# Patient Record
Sex: Male | Born: 2000 | Race: Black or African American | Hispanic: No | Marital: Single | Smoking: Never smoker
Health system: Southern US, Community
[De-identification: ages and names within clinical notes are randomized; demographics above are authoritative.]

---
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
## 2023-04-23 DIAGNOSIS — Z419 Encounter for procedure for purposes other than remedying health state, unspecified: Secondary | ICD-10-CM | POA: Diagnosis not present

## 2023-05-24 DIAGNOSIS — Z419 Encounter for procedure for purposes other than remedying health state, unspecified: Secondary | ICD-10-CM | POA: Diagnosis not present

## 2023-06-23 DIAGNOSIS — Z419 Encounter for procedure for purposes other than remedying health state, unspecified: Secondary | ICD-10-CM | POA: Diagnosis not present

## 2023-07-04 ENCOUNTER — Ambulatory Visit: Payer: Medicaid Other | Admitting: Nurse Practitioner

## 2023-07-04 ENCOUNTER — Telehealth: Payer: Self-pay

## 2023-07-04 NOTE — Telephone Encounter (Signed)
Np did not show up for appt today. Yet to block from future re-scheduling. Pls advise.

## 2023-07-08 NOTE — Telephone Encounter (Signed)
Unable to reschedule at [facility]

## 2023-07-24 DIAGNOSIS — Z419 Encounter for procedure for purposes other than remedying health state, unspecified: Secondary | ICD-10-CM | POA: Diagnosis not present

## 2023-08-24 DIAGNOSIS — Z419 Encounter for procedure for purposes other than remedying health state, unspecified: Secondary | ICD-10-CM | POA: Diagnosis not present

## 2023-09-21 DIAGNOSIS — Z419 Encounter for procedure for purposes other than remedying health state, unspecified: Secondary | ICD-10-CM | POA: Diagnosis not present

## 2023-11-02 DIAGNOSIS — Z419 Encounter for procedure for purposes other than remedying health state, unspecified: Secondary | ICD-10-CM | POA: Diagnosis not present

## 2023-12-02 DIAGNOSIS — Z419 Encounter for procedure for purposes other than remedying health state, unspecified: Secondary | ICD-10-CM | POA: Diagnosis not present

## 2024-01-02 DIAGNOSIS — Z419 Encounter for procedure for purposes other than remedying health state, unspecified: Secondary | ICD-10-CM | POA: Diagnosis not present

## 2024-02-01 DIAGNOSIS — Z419 Encounter for procedure for purposes other than remedying health state, unspecified: Secondary | ICD-10-CM | POA: Diagnosis not present
